# Patient Record
Sex: Female | Born: 2000 | Hispanic: No | Marital: Single | Smoking: Never smoker
Health system: Southern US, Community
[De-identification: ages and names within clinical notes are randomized; demographics above are authoritative.]

## PROBLEM LIST (undated history)

## (undated) DIAGNOSIS — N200 Calculus of kidney: Secondary | ICD-10-CM

## (undated) HISTORY — PX: NO PAST SURGERIES: SHX2092

---
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
## 2018-03-14 ENCOUNTER — Ambulatory Visit: Payer: Self-pay | Admitting: Physician Assistant

## 2018-05-02 ENCOUNTER — Ambulatory Visit (INDEPENDENT_AMBULATORY_CARE_PROVIDER_SITE_OTHER): Payer: Medicaid Other

## 2018-05-02 ENCOUNTER — Other Ambulatory Visit: Payer: Self-pay

## 2018-05-02 ENCOUNTER — Encounter: Payer: Self-pay | Admitting: Physician Assistant

## 2018-05-02 ENCOUNTER — Ambulatory Visit: Payer: Medicaid Other | Admitting: Physician Assistant

## 2018-05-02 VITALS — BP 95/66 | HR 71 | Temp 98.1°F | Resp 16 | Ht 60.35 in | Wt 108.4 lb

## 2018-05-02 DIAGNOSIS — R21 Rash and other nonspecific skin eruption: Secondary | ICD-10-CM | POA: Diagnosis not present

## 2018-05-02 DIAGNOSIS — R109 Unspecified abdominal pain: Secondary | ICD-10-CM

## 2018-05-02 LAB — POCT URINALYSIS DIP (MANUAL ENTRY)
Bilirubin, UA: NEGATIVE
Glucose, UA: NEGATIVE mg/dL
Ketones, POC UA: NEGATIVE mg/dL
Leukocytes, UA: NEGATIVE
NITRITE UA: NEGATIVE
PH UA: 7 (ref 5.0–8.0)
PROTEIN UA: NEGATIVE mg/dL
SPEC GRAV UA: 1.02 (ref 1.010–1.025)
UROBILINOGEN UA: 0.2 U/dL

## 2018-05-02 LAB — POCT URINE PREGNANCY: PREG TEST UR: NEGATIVE

## 2018-05-02 MED ORDER — PERMETHRIN 5 % EX CREA: 1.0000 "application " | TOPICAL_CREAM | Freq: Once | CUTANEOUS | 0 refills | Status: AC

## 2018-05-02 MED ORDER — NAPROXEN 500 MG PO TABS: 500.0000 mg | ORAL_TABLET | Freq: Two times a day (BID) | ORAL | 0 refills | Status: AC

## 2018-05-02 NOTE — Progress Notes (Signed)
05/02/2018 4:31 PM   DOB: 09/17/2000 / MRN: [id number]  SUBJECTIVE:  Mickie HillierYareli Rojas is a 17 y.o. female presenting for right sided flank pain and rash.Rash is itchy and symptoms present for about 1 week. She did do some hiking in the woods recently.  The problem is spreading. She has tried nothing.  No fever, chills, photophobia, nausea.  Complains of left sided flank pain worse with movement. No dysuria or hematuria.  No abdominal pain or change in bowel habits.  Symptoms present for about three weeks.  Denies sexual activity. Has tried ibuprofen which helps but the pain comes back.   She has No Known Allergies.   She  has a past medical history of Kidney stone.    She  reports that she has never smoked. She has never used smokeless tobacco. She reports that she does not drink alcohol or use drugs. She  reports that she does not engage in sexual activity. The patient  has a past surgical history that includes No past surgeries.  Her family history includes Asthma in her mother; Diabetes in her father.  Review of Systems  Constitutional: Negative for chills, diaphoresis and fever.  Eyes: Negative.   Respiratory: Negative for cough, hemoptysis, sputum production, shortness of breath and wheezing.   Cardiovascular: Negative for chest pain, orthopnea and leg swelling.  Gastrointestinal: Negative for nausea.  Genitourinary: Negative for dysuria, flank pain, frequency, hematuria and urgency.  Musculoskeletal: Positive for back pain and myalgias. Negative for falls, joint pain and neck pain.  Skin: Positive for itching and rash.  Neurological: Negative for dizziness, sensory change, speech change, focal weakness and headaches.  Psychiatric/Behavioral: Negative for depression.    The problem list and medications were reviewed and updated by myself where necessary and exist elsewhere in the encounter.   OBJECTIVE:  BP 95/66   Pulse 71   Temp 98.1 F (36.7 C)   Resp 16   Ht 5'  0.35" (1.533 m)   Wt 108 lb 6.4 oz (49.2 kg)   SpO2 98%   BMI 20.92 kg/m   Wt Readings from Last 3 Encounters:  05/02/18 108 lb 6.4 oz (49.2 kg) (14 %, Z= -1.07)*  11/01/16 104 lb (47.2 kg) (11 %, Z= -1.20)*  09/25/16 106 lb 4.2 oz (48.2 kg) (16 %, Z= -1.01)*   * Growth percentiles are based on CDC (Girls, 2-20 Years) data.   Temp Readings from Last 3 Encounters:  05/02/18 98.1 F (36.7 C)  11/01/16 98.7 F (37.1 C) (Oral)  09/25/16 98.6 F (37 C) (Temporal)   BP Readings from Last 3 Encounters:  05/02/18 95/66  11/01/16 104/63 (31 %, Z = -0.49 /  44 %, Z = -0.16)*  09/25/16 (!) 100/56 (18 %, Z = -0.93 /  19 %, Z = -0.89)*   *BP percentiles are based on the August 2017 AAP Clinical Practice Guideline for girls   Pulse Readings from Last 3 Encounters:  05/02/18 71  11/01/16 77  09/25/16 102    Physical Exam  Constitutional: She is oriented to person, place, and time. She appears well-nourished.  Non-toxic appearance. No distress.  Eyes: Pupils are equal, round, and reactive to light. EOM are normal.  Cardiovascular: Normal rate, regular rhythm, S1 normal, S2 normal, normal heart sounds and intact distal pulses. Exam reveals no gallop, no friction rub and no decreased pulses.  No murmur heard. Pulmonary/Chest: Effort normal. No stridor. No respiratory distress. She has no wheezes. She has no rales. She  exhibits no tenderness.  Abdominal: She exhibits no distension. There is CVA tenderness (right sided and into the ribs laterally).  Musculoskeletal: She exhibits no edema.  Neurological: She is alert and oriented to person, place, and time. No cranial nerve deficit. Gait normal.  Skin: Skin is warm and dry. Rash (Several discrete 1 mm papules on the legs and arms, consitent with bug bites. ) noted. She is not diaphoretic. No pallor.  Psychiatric: She has a normal mood and affect.  Vitals reviewed.   Dg Abd 1 View  Result Date: 05/02/2018 CLINICAL DATA:  Right-sided flank  pain. EXAM: ABDOMEN - 1 VIEW COMPARISON:  11/01/2016 FINDINGS: The bowel gas pattern is normal. No radio-opaque calculi or other significant radiographic abnormality are seen. IMPRESSION: Negative. Electronically Signed   By: Amie Portlandavid  Ormond M.D.   On: 05/02/2018 15:40    No results found for: HGBA1C  Lab Results  Component Value Date   WBC 7.1 11/01/2016   HGB 12.6 11/01/2016   HCT 36.9 11/01/2016   MCV 92.5 11/01/2016   PLT 164 11/01/2016    Lab Results  Component Value Date   CREATININE 0.65 11/01/2016   BUN 12 11/01/2016   NA 141 11/01/2016   K 3.8 11/01/2016   CL 108 11/01/2016   CO2 22 11/01/2016    Lab Results  Component Value Date   ALT 13 (L) 11/01/2016   AST 18 11/01/2016   ALKPHOS 46 (L) 11/01/2016   BILITOT 1.0 11/01/2016    No results found for: TSH  No results found for: CHOL, HDL, LDLCALC, LDLDIRECT, TRIG, CHOLHDL   ASSESSMENT AND PLAN:  Diagnoses and all orders for this visit:  Acute left flank pain: Possibly a stone but not radiographic.  Advised copious water intake and naprosyn.  RTC is not improving.  -     POCT urinalysis dipstick -     DG Abd 1 View; Future -     POCT urine pregnancy -     naproxen (NAPROSYN) 500 MG tablet; Take 1 tablet (500 mg total) by mouth 2 (two) times daily with a meal. -     permethrin (ELIMITE) 5 % cream; Apply 1 application topically once for 1 dose. Repeat in one week.  Rash: Permethrin.   The patient is advised to call or return to clinic if she does not see an improvement in symptoms, or to seek the care of the closest emergency department if she worsens with the above plan.   Deliah BostonMichael Dayrin Stallone, MHS, PA-C [facility] 05/02/2018 4:31 PM

## 2018-05-02 NOTE — Patient Instructions (Addendum)
   You have a normal xray and your urine is largely normal. Start the new pain med.  Apply the cream as directed.    If you have lab work done today you will be contacted with your lab results within the next 2 weeks.  If you have not heard from us then please contact us. The fastest way to get your results is to register for My Chart.   IF you received an x-ray today, you will receive an invoice from [facility]. Please contact [facility] at [phone] with questions or concerns regarding your invoice.   IF you received labwork today, you will receive an invoice from [facility]. Please contact [facility] at [phone] with questions or concerns regarding your invoice.   Our billing staff will not be able to assist you with questions regarding bills from these companies.  You will be contacted with the lab results as soon as they are available. The fastest way to get your results is to activate your My Chart account. Instructions are located on the last page of this paperwork. If you have not heard from us regarding the results in 2 weeks, please contact this office.

## 2018-11-13 IMAGING — US US THYROID
1 series · 13 of 25 positions shown · non-contrast
Comparison: None.

CLINICAL DATA: Thyroid nodule

EXAM:
THYROID ULTRASOUND
TECHNIQUE: Ultrasound examination of the thyroid gland and adjacent soft
tissues was performed.

[Series 1: us thyroid · 0.03mm/px · 13 of 68 slices shown]
[im 1/68]
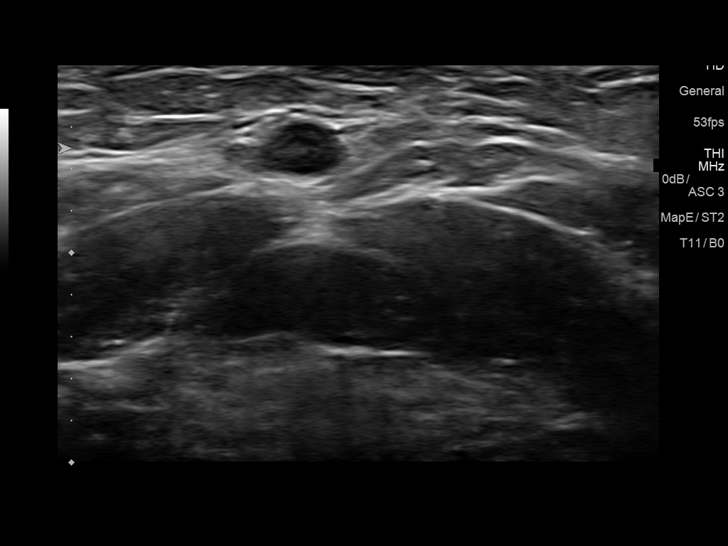
[im 6/68]
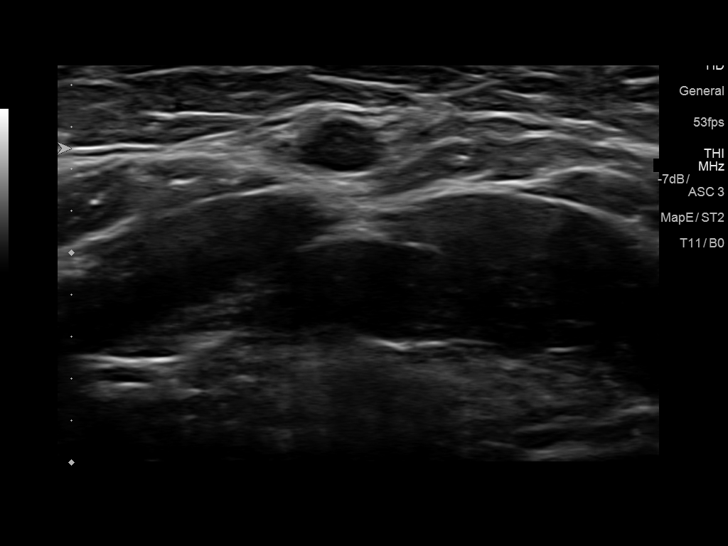
[im 12/68]
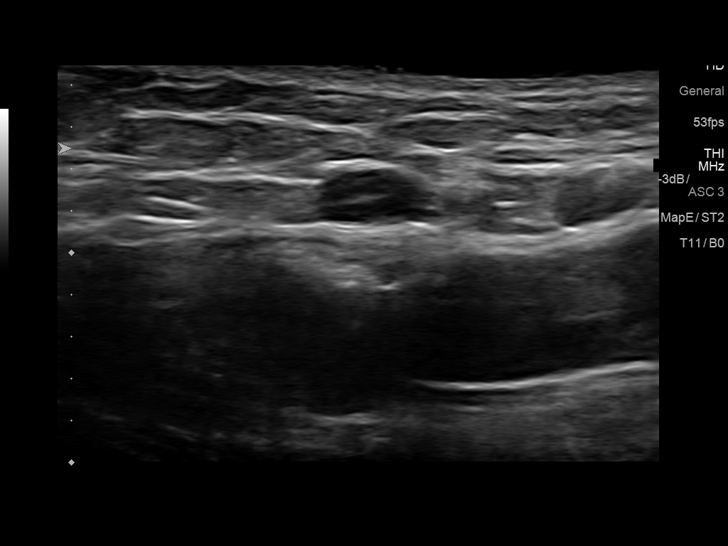
[im 17/68]
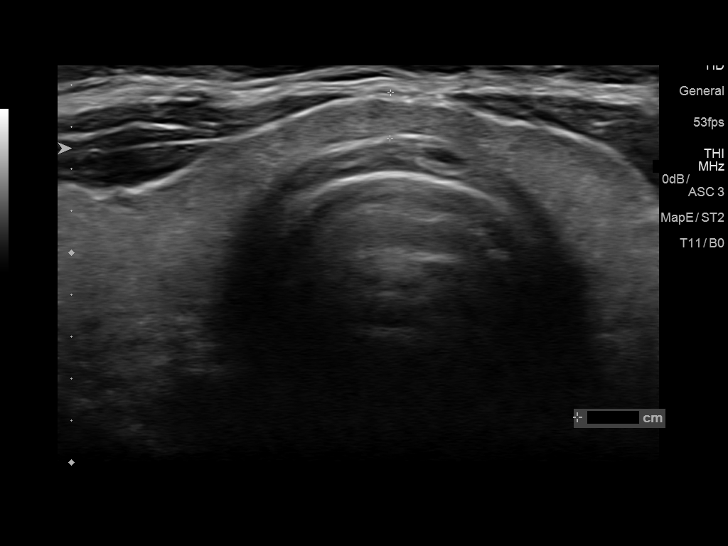
[im 23/68]
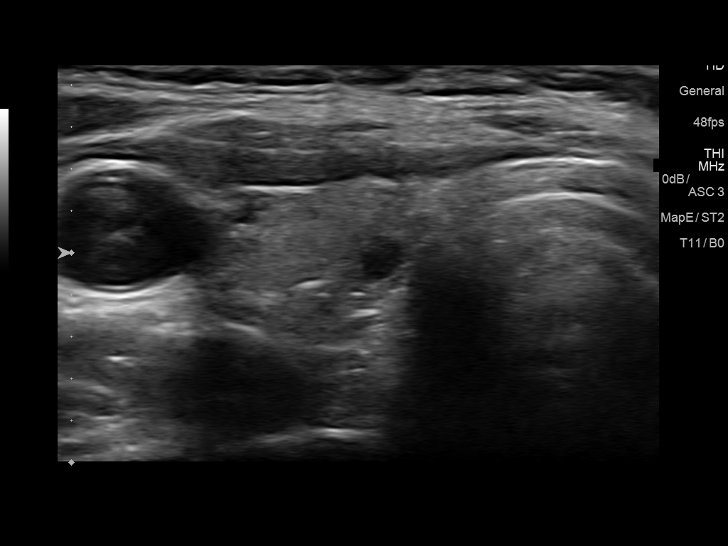
[im 28/68]
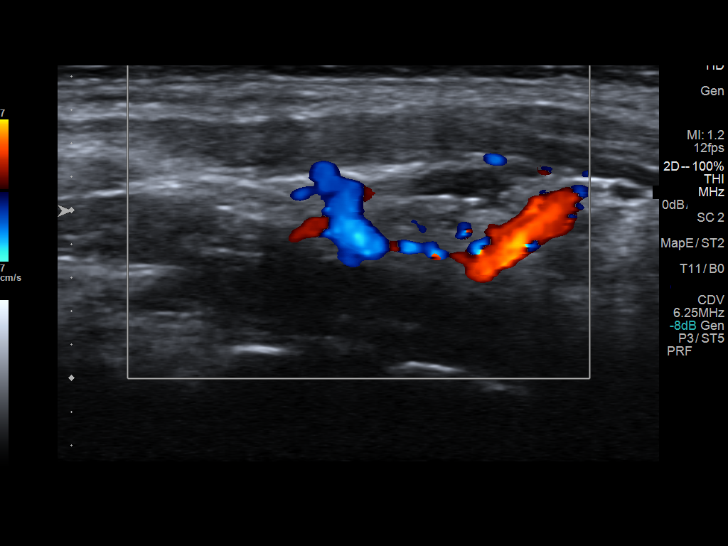
[im 34/68]
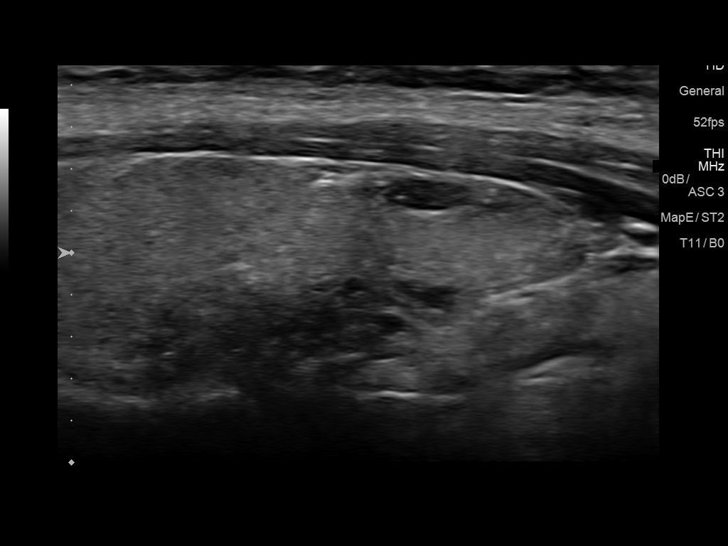
[im 40/68]
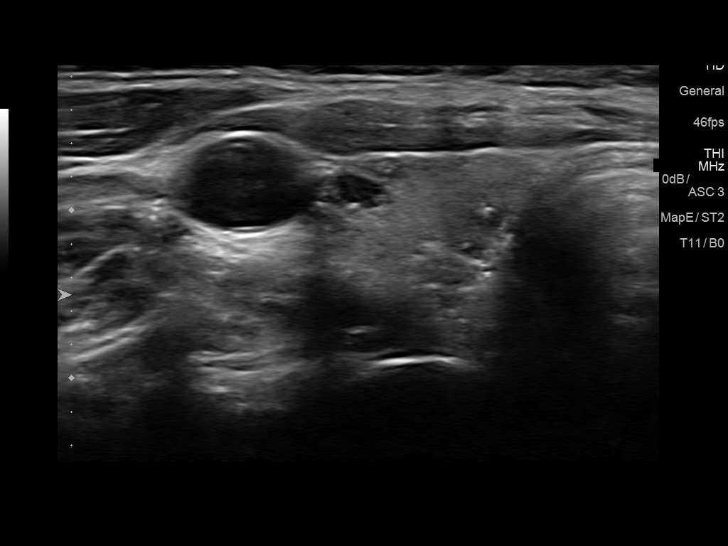
[im 45/68]
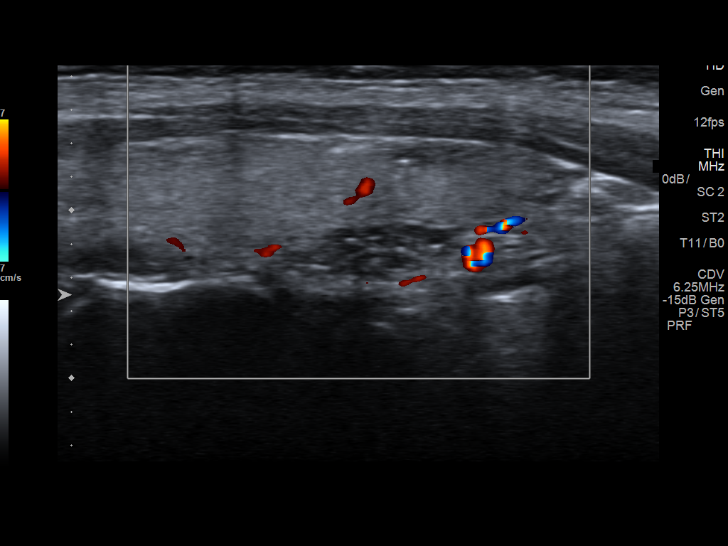
[im 51/68]
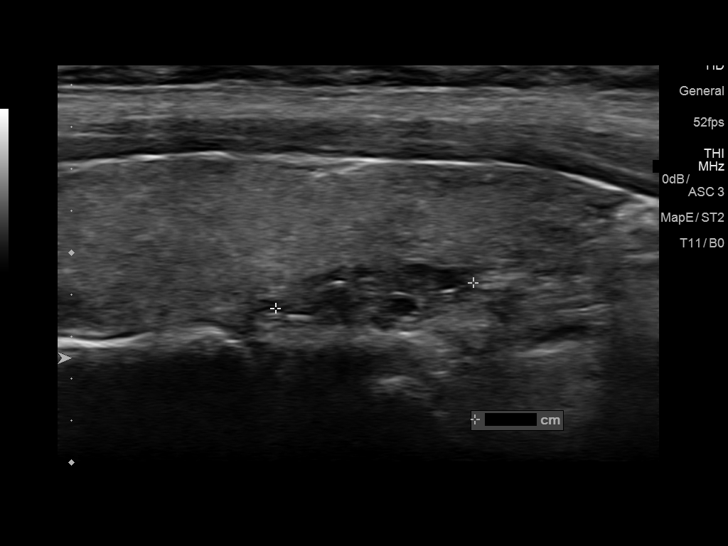
[im 56/68]
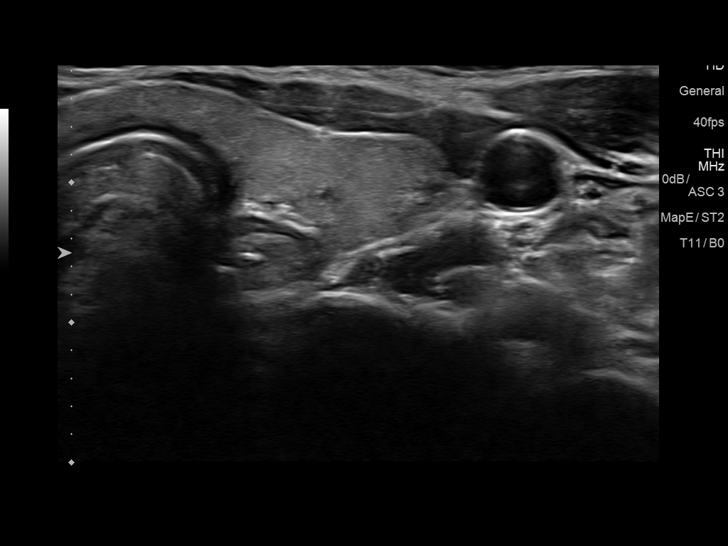
[im 62/68]
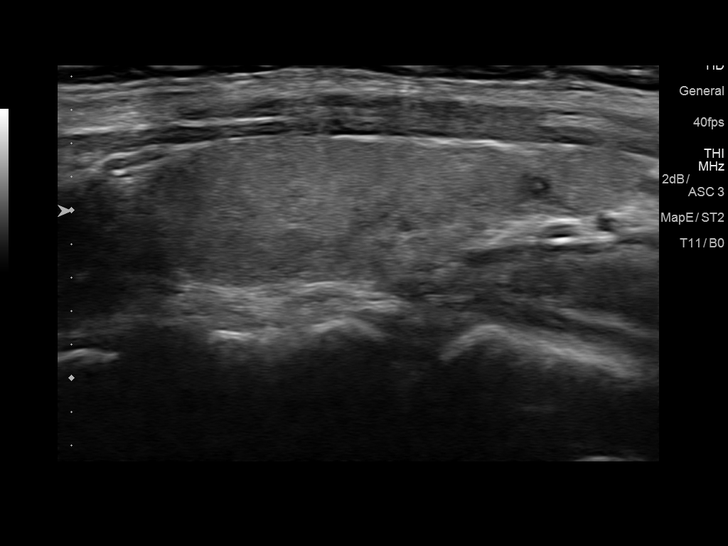
[im 68/68]
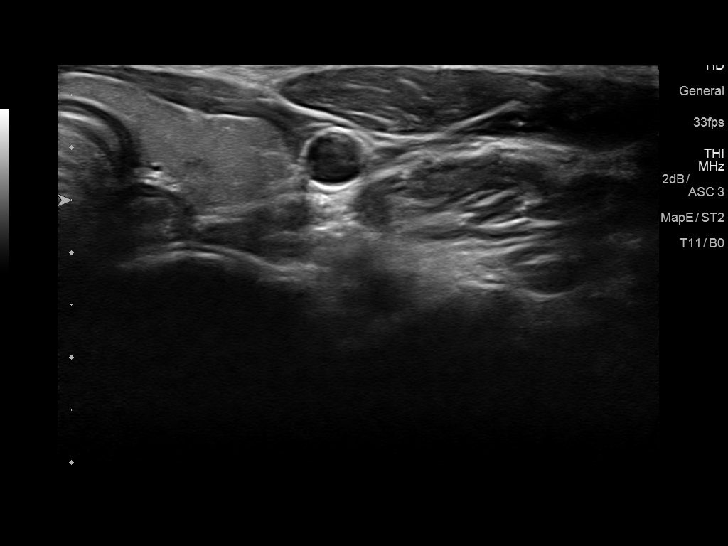

[13 of 25 positions shown; findings below may reference images not displayed]

FINDINGS: Parenchymal Echotexture: Mildly heterogenous

Isthmus: 2 mm

Right lobe: 3.5 x 1.3 x 1.9 cm

Left lobe: 3.3 x 0.9 x 1.5 cm

_________________________________________________________

Estimated total number of nodules >/= 1 cm: 1

Number of spongiform nodules >/=  2 cm not described below (TR1): 0

Number of mixed cystic and solid nodules >/= 1.5 cm not described
below (TR2): 0

_________________________________________________________

Nodule # 1:

Location: Right; Inferior

Maximum size: 1.0 cm; Other 2 dimensions: 0.3 x 0.5 cm

Composition: mixed cystic and solid (1)

Echogenicity: hypoechoic (2)

Shape: not taller-than-wide (0)

Margins: ill-defined (0)

Echogenic foci: none (0)

ACR TI-RADS total points: 3.

ACR TI-RADS risk category: TR3 (3 points).

ACR TI-RADS recommendations:

Given size (<1.4 cm) and appearance, this nodule does NOT meet
TI-RADS criteria for biopsy or dedicated follow-up.

_________________________________________________________

There are additional scattered subcentimeter mixed cystic and solid
nodules bilaterally all measuring 5 mm or less in size. These would
not meet criteria for any biopsy or follow-up. No other significant
thyroid abnormality. Normal vascularity.

Midline anterior neck palpable nodule appears to correlate with a
small subcutaneous hypoechoic lymph node measuring 5 mm in short
axis.
IMPRESSION: 1 cm right inferior TR 3 nodule does not meet criteria for biopsy or
any follow-up.

Palpable midline small nodule appears to correlate with a small
benign lymph node.

The above is in keeping with the ACR TI-RADS recommendations - [HOSPITAL] 8734;[DATE].

## 2019-03-28 IMAGING — DX DG ABDOMEN 1V
1 series · 1 of 1 positions shown · non-contrast
Comparison: 11/01/2016

CLINICAL DATA: Right-sided flank pain.

EXAM:
ABDOMEN - 1 VIEW

[abdomen kub]
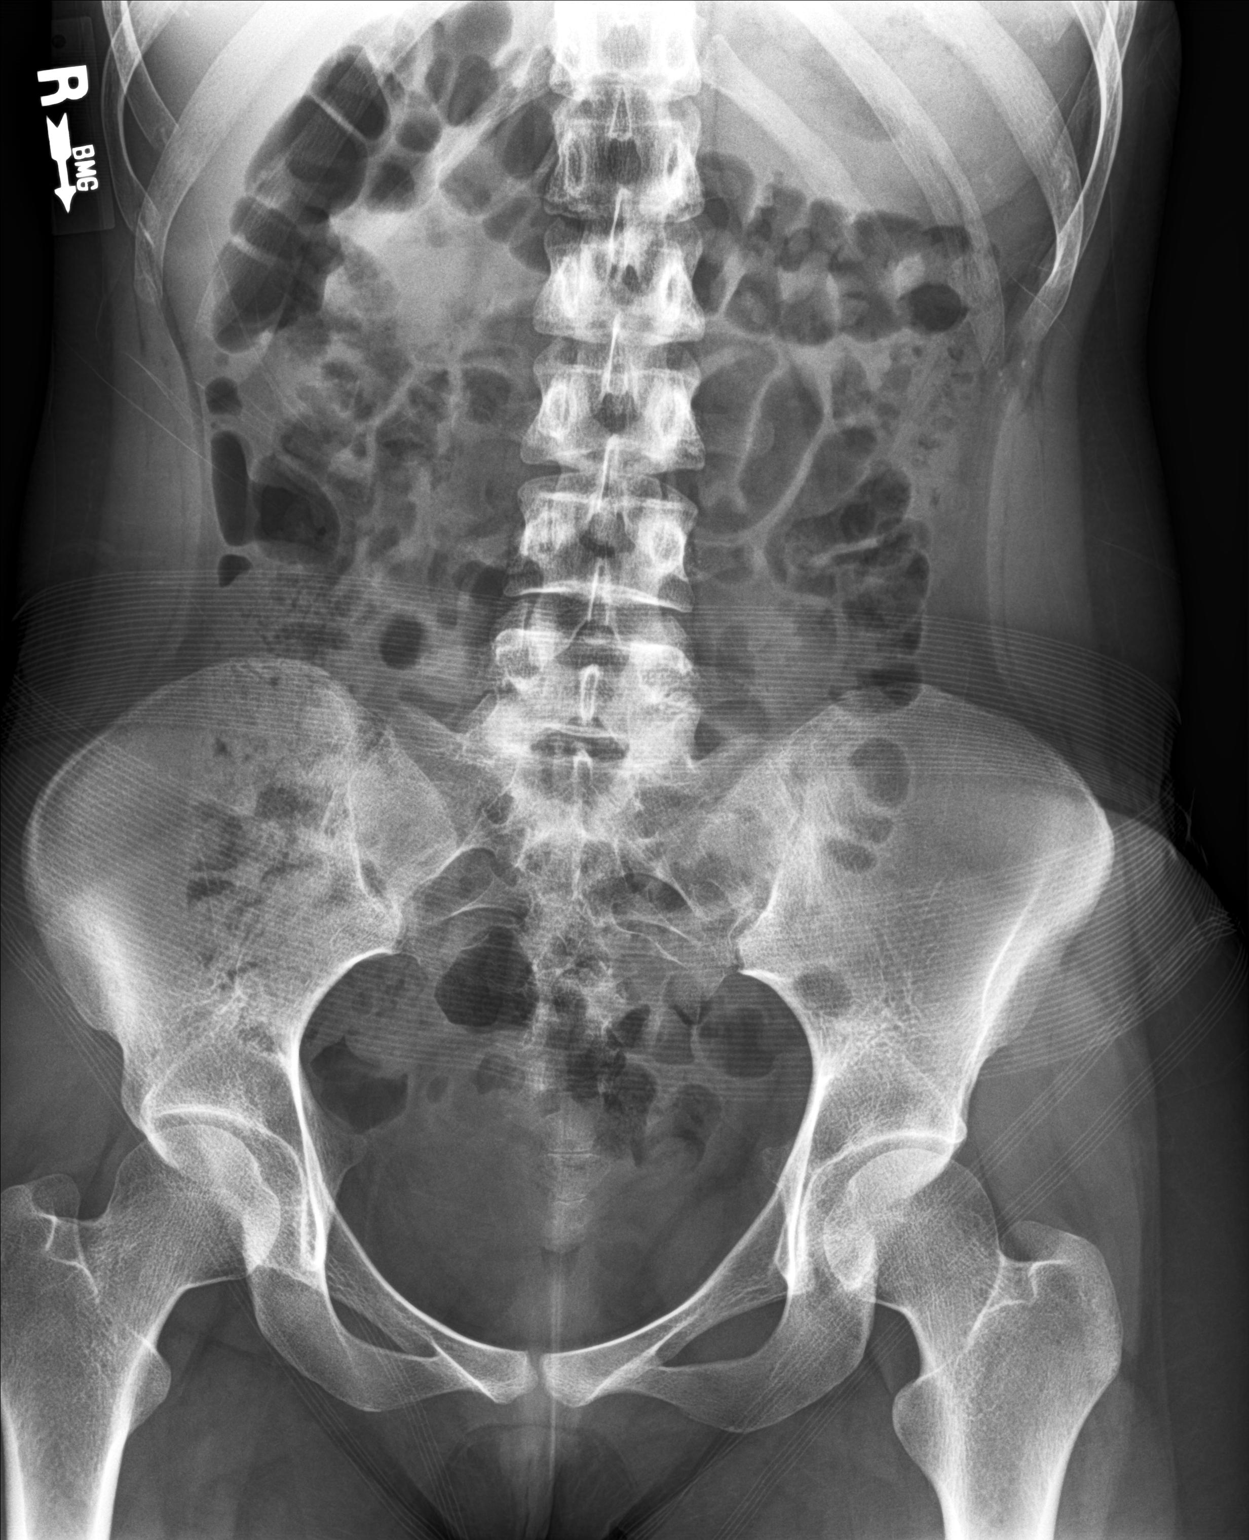

[1 of 1 positions shown; findings below may reference images not displayed]

FINDINGS: The bowel gas pattern is normal. No radio-opaque calculi or other
significant radiographic abnormality are seen.
IMPRESSION: Negative.

## 2019-08-13 ENCOUNTER — Other Ambulatory Visit: Payer: Self-pay

## 2019-08-13 DIAGNOSIS — Z20822 Contact with and (suspected) exposure to covid-19: Secondary | ICD-10-CM

## 2019-08-14 LAB — NOVEL CORONAVIRUS, NAA: SARS-CoV-2, NAA: NOT DETECTED

## 2019-08-27 ENCOUNTER — Other Ambulatory Visit: Payer: Self-pay

## 2019-08-27 DIAGNOSIS — Z20822 Contact with and (suspected) exposure to covid-19: Secondary | ICD-10-CM

## 2019-08-29 LAB — NOVEL CORONAVIRUS, NAA: SARS-CoV-2, NAA: NOT DETECTED

## 2024-09-24 ENCOUNTER — Emergency Department (HOSPITAL_COMMUNITY): Payer: Self-pay

## 2024-09-24 ENCOUNTER — Emergency Department (HOSPITAL_COMMUNITY)
Admission: EM | Admit: 2024-09-24 | Discharge: 2024-09-25 | Payer: Self-pay | Attending: Emergency Medicine | Admitting: Emergency Medicine

## 2024-09-24 ENCOUNTER — Other Ambulatory Visit: Payer: Self-pay

## 2024-09-24 ENCOUNTER — Encounter (HOSPITAL_COMMUNITY): Payer: Self-pay

## 2024-09-24 DIAGNOSIS — G43909 Migraine, unspecified, not intractable, without status migrainosus: Secondary | ICD-10-CM | POA: Insufficient documentation

## 2024-09-24 DIAGNOSIS — R519 Headache, unspecified: Secondary | ICD-10-CM | POA: Diagnosis present

## 2024-09-24 DIAGNOSIS — Z5321 Procedure and treatment not carried out due to patient leaving prior to being seen by health care provider: Secondary | ICD-10-CM | POA: Insufficient documentation

## 2024-09-24 LAB — BASIC METABOLIC PANEL WITH GFR
Anion gap: 12 (ref 5–15)
BUN: 17 mg/dL (ref 6–20)
CO2: 23 mmol/L (ref 22–32)
Calcium: 9.6 mg/dL (ref 8.9–10.3)
Chloride: 104 mmol/L (ref 98–111)
Creatinine, Ser: 0.7 mg/dL (ref 0.44–1.00)
GFR, Estimated: >60 mL/min
Glucose, Bld: 103 mg/dL — ABNORMAL HIGH (ref 70–99)
Potassium: 3.9 mmol/L (ref 3.5–5.1)
Sodium: 139 mmol/L (ref 135–145)

## 2024-09-24 LAB — RESP PANEL BY RT-PCR (RSV, FLU A&B, COVID)  RVPGX2
Influenza A by PCR: NEGATIVE
Influenza B by PCR: NEGATIVE
Resp Syncytial Virus by PCR: NEGATIVE
SARS Coronavirus 2 by RT PCR: NEGATIVE

## 2024-09-24 LAB — CBC
HCT: 41.5 % (ref 36.0–46.0)
Hemoglobin: 14 g/dL (ref 12.0–15.0)
MCH: 32 pg (ref 26.0–34.0)
MCHC: 33.7 g/dL (ref 30.0–36.0)
MCV: 95 fL (ref 80.0–100.0)
Platelets: 242 K/uL (ref 150–400)
RBC: 4.37 MIL/uL (ref 3.87–5.11)
RDW: 11.8 % (ref 11.5–15.5)
WBC: 13.5 K/uL — ABNORMAL HIGH (ref 4.0–10.5)
nRBC: 0 % (ref 0.0–0.2)

## 2024-09-24 LAB — HCG, SERUM, QUALITATIVE: Preg, Serum: NEGATIVE

## 2024-09-24 MED ORDER — PROCHLORPERAZINE EDISYLATE 10 MG/2ML IJ SOLN
10.0000 mg | Freq: Once | INTRAMUSCULAR | Status: AC
  Administered 2024-09-24: 10 mg via INTRAVENOUS
  Filled 2024-09-24: qty 2

## 2024-09-24 MED ORDER — DIPHENHYDRAMINE HCL 50 MG/ML IJ SOLN
12.5000 mg | Freq: Once | INTRAMUSCULAR | Status: AC
  Administered 2024-09-24: 12.5 mg via INTRAVENOUS
  Filled 2024-09-24: qty 1

## 2024-09-24 MED ORDER — LACTATED RINGERS IV BOLUS
1000.0000 mL | Freq: Once | INTRAVENOUS | Status: AC
  Administered 2024-09-24: 1000 mL via INTRAVENOUS

## 2024-09-24 NOTE — ED Provider Triage Note (Signed)
 Emergency Medicine Provider Triage Evaluation Note  Stephanie Rojas , a 24 y.o. female  was evaluated in triage.  Pt complains of headache since yesterday. Feels similar to her migraines with some changes. She reports pain down her neck as well as some nausea which is not usually with her migraines. Had migraine IM cocktail at [facility] without relief. Reports some chills without recorded fever. Denies sudden onset. Same locations as her migraines.  Review of Systems  Positive:  Negative:   Physical Exam  BP 102/68 (BP Location: Right Arm)   Pulse 73   Temp 98.9 F (37.2 C) (Oral)   Resp 15   SpO2 100%  Gen:   Awake, no distress   Resp:  Normal effort  MSK:   Moves extremities without difficulty  Other:  Cranial nerves grossly intact. No pronator drift.   Medical Decision Making  Medically screening exam initiated at 9:25 PM.  Appropriate orders placed.  Stephanie Rojas was informed that the remainder of the evaluation will be completed by another provider, this initial triage assessment does not replace that evaluation, and the importance of remaining in the ED until their evaluation is complete.  CT and labs ordered.    Bernis Ernst, PA-C 09/24/24 2130

## 2024-09-24 NOTE — ED Triage Notes (Signed)
 Pt arrived from UC via POV c/o migraine that began yesterday 10/10 pain. UC adm decacron, toradol , and zofran  @ 1911. Pt states that it did not help at all. UC sent pt to ED for further eval.

## 2024-09-25 NOTE — ED Notes (Signed)
 PT left AMA
